# Patient Record
Sex: Male | Born: 1985 | Race: White | Hispanic: No | Marital: Married | Smoking: Current some day smoker
Health system: Southern US, Community
[De-identification: ages and names within clinical notes are randomized; demographics above are authoritative.]

## PROBLEM LIST (undated history)

## (undated) HISTORY — PX: TONSILLECTOMY: SUR1361

---
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
## 2007-02-19 ENCOUNTER — Emergency Department (HOSPITAL_COMMUNITY): Admission: EM | Admit: 2007-02-19 | Discharge: 2007-02-19 | Payer: Self-pay | Admitting: Emergency Medicine

## 2008-09-28 ENCOUNTER — Ambulatory Visit: Payer: Self-pay | Admitting: Internal Medicine

## 2014-02-11 ENCOUNTER — Emergency Department (HOSPITAL_COMMUNITY)
Admission: EM | Admit: 2014-02-11 | Discharge: 2014-02-11 | Disposition: A | Discharge status: Home / Self Care | Payer: No Typology Code available for payment source | Attending: Emergency Medicine | Admitting: Emergency Medicine

## 2014-02-11 ENCOUNTER — Encounter (HOSPITAL_COMMUNITY): Payer: Self-pay | Admitting: Emergency Medicine

## 2014-02-11 ENCOUNTER — Emergency Department (HOSPITAL_COMMUNITY): Payer: No Typology Code available for payment source

## 2014-02-11 DIAGNOSIS — S022XXA Fracture of nasal bones, initial encounter for closed fracture: Secondary | ICD-10-CM | POA: Diagnosis not present

## 2014-02-11 DIAGNOSIS — S02109A Fracture of base of skull, unspecified side, initial encounter for closed fracture: Secondary | ICD-10-CM | POA: Insufficient documentation

## 2014-02-11 DIAGNOSIS — S02401A Maxillary fracture, unspecified, initial encounter for closed fracture: Secondary | ICD-10-CM

## 2014-02-11 DIAGNOSIS — Y92410 Unspecified street and highway as the place of occurrence of the external cause: Secondary | ICD-10-CM | POA: Insufficient documentation

## 2014-02-11 DIAGNOSIS — S0231XA Fracture of orbital floor, right side, initial encounter for closed fracture: Secondary | ICD-10-CM

## 2014-02-11 DIAGNOSIS — S0230XA Fracture of orbital floor, unspecified side, initial encounter for closed fracture: Secondary | ICD-10-CM | POA: Diagnosis not present

## 2014-02-11 DIAGNOSIS — Y9389 Activity, other specified: Secondary | ICD-10-CM | POA: Insufficient documentation

## 2014-02-11 DIAGNOSIS — S0990XA Unspecified injury of head, initial encounter: Secondary | ICD-10-CM

## 2014-02-11 DIAGNOSIS — S060X9A Concussion with loss of consciousness of unspecified duration, initial encounter: Secondary | ICD-10-CM | POA: Diagnosis not present

## 2014-02-11 DIAGNOSIS — S060XAA Concussion with loss of consciousness status unknown, initial encounter: Secondary | ICD-10-CM

## 2014-02-11 LAB — I-STAT CHEM 8, ED
BUN: 14 mg/dL (ref 6–23)
CREATININE: 0.9 mg/dL (ref 0.50–1.35)
Calcium, Ion: 1.13 mmol/L (ref 1.12–1.23)
Chloride: 103 mEq/L (ref 96–112)
GLUCOSE: 124 mg/dL — AB (ref 70–99)
HCT: 44 % (ref 39.0–52.0)
Hemoglobin: 15 g/dL (ref 13.0–17.0)
Potassium: 4.1 mEq/L (ref 3.7–5.3)
SODIUM: 140 meq/L (ref 137–147)
TCO2: 26 mmol/L (ref 0–100)

## 2014-02-11 MED ORDER — ONDANSETRON HCL 4 MG PO TABS: 4.0000 mg | ORAL_TABLET | Freq: Four times a day (QID) | ORAL | Status: AC

## 2014-02-11 MED ORDER — FENTANYL CITRATE 0.05 MG/ML IJ SOLN
50.0000 ug | Freq: Once | INTRAMUSCULAR | Status: AC
  Administered 2014-02-11: 50 ug via INTRAVENOUS
  Filled 2014-02-11: qty 2

## 2014-02-11 MED ORDER — OXYCODONE-ACETAMINOPHEN 5-325 MG PO TABS: 1.0000 | ORAL_TABLET | ORAL | Status: AC | PRN

## 2014-02-11 MED ORDER — DOCUSATE SODIUM 100 MG PO CAPS: 100.0000 mg | ORAL_CAPSULE | Freq: Two times a day (BID) | ORAL | Status: AC
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
## 2014-02-11 NOTE — ED Notes (Signed)
Steri strips over RT eye .
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
